# Patient Record
Sex: Male | Born: 1985 | Hispanic: Yes | Marital: Single | Smoking: Never smoker
Health system: Southern US, Community
[De-identification: ages and names within clinical notes are randomized; demographics above are authoritative.]

---

## 2016-07-05 ENCOUNTER — Emergency Department (HOSPITAL_COMMUNITY): Payer: Self-pay

## 2016-07-05 ENCOUNTER — Encounter (HOSPITAL_COMMUNITY): Payer: Self-pay | Admitting: *Deleted

## 2016-07-05 ENCOUNTER — Emergency Department (HOSPITAL_COMMUNITY)
Admission: EM | Admit: 2016-07-05 | Discharge: 2016-07-06 | Disposition: A | Discharge status: Home / Self Care | Payer: Self-pay | Attending: Emergency Medicine | Admitting: Emergency Medicine

## 2016-07-05 DIAGNOSIS — Y9389 Activity, other specified: Secondary | ICD-10-CM | POA: Insufficient documentation

## 2016-07-05 DIAGNOSIS — F10920 Alcohol use, unspecified with intoxication, uncomplicated: Secondary | ICD-10-CM

## 2016-07-05 DIAGNOSIS — F10120 Alcohol abuse with intoxication, uncomplicated: Secondary | ICD-10-CM | POA: Insufficient documentation

## 2016-07-05 DIAGNOSIS — Y929 Unspecified place or not applicable: Secondary | ICD-10-CM | POA: Insufficient documentation

## 2016-07-05 DIAGNOSIS — Y999 Unspecified external cause status: Secondary | ICD-10-CM | POA: Insufficient documentation

## 2016-07-05 DIAGNOSIS — S0083XA Contusion of other part of head, initial encounter: Secondary | ICD-10-CM | POA: Insufficient documentation

## 2016-07-05 NOTE — Progress Notes (Signed)
CM spoke with pt who confirms uninsured [place] resident with no pcp.  CM discussed and provided written information to assist pt with determining choice for uninsured accepting pcps, discussed the importance of pcp vs EDP services for f/u care, www.needymeds.org, www.goodrx.com, discounted pharmacies and other [place] resources such as [facility] , [facility], affordable care act, financial assistance, uninsured dental services, [place] med assist, DSS and  health department  Reviewed resources for [place] uninsured accepting pcps like [facility], [facility] [address], [facility], [facility], local urgent care centers, [facility], [facility], general medical clinics, [facility], [facility] plus others, medication resources, [facility] out [facility]ies and housing Pt voiced understanding and appreciation of resources provided   Provided [facility] contact information Pt agreed to a referral Cm completed referral Pt to be contact by [facility] clinical liaison  
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
## 2016-07-05 NOTE — ED Notes (Signed)
Patient sleeping, breathing even and unlabored, NAD at this time. 
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
## 2016-07-05 NOTE — ED Notes (Signed)
Patient sleeping, breathing even and unlabored.  NAD at this time. 

## 2016-07-05 NOTE — ED Notes (Signed)
Pt now has correct ID band from registration
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
## 2016-07-06 NOTE — ED Notes (Signed)
Patient d/c'd self care.  F/U reviewed with patient patient verbalized understanding.
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
## 2016-07-20 ENCOUNTER — Emergency Department (HOSPITAL_COMMUNITY)
Admission: EM | Admit: 2016-07-20 | Discharge: 2016-07-21 | Disposition: A | Discharge status: Home / Self Care | Payer: No Typology Code available for payment source | Attending: Emergency Medicine | Admitting: Emergency Medicine

## 2016-07-20 ENCOUNTER — Emergency Department (HOSPITAL_COMMUNITY): Payer: No Typology Code available for payment source

## 2016-07-20 ENCOUNTER — Encounter (HOSPITAL_COMMUNITY): Payer: Self-pay

## 2016-07-20 DIAGNOSIS — Y9389 Activity, other specified: Secondary | ICD-10-CM | POA: Diagnosis not present

## 2016-07-20 DIAGNOSIS — F1290 Cannabis use, unspecified, uncomplicated: Secondary | ICD-10-CM | POA: Diagnosis not present

## 2016-07-20 DIAGNOSIS — Z5181 Encounter for therapeutic drug level monitoring: Secondary | ICD-10-CM | POA: Diagnosis not present

## 2016-07-20 DIAGNOSIS — F1410 Cocaine abuse, uncomplicated: Secondary | ICD-10-CM | POA: Insufficient documentation

## 2016-07-20 DIAGNOSIS — Y92410 Unspecified street and highway as the place of occurrence of the external cause: Secondary | ICD-10-CM | POA: Insufficient documentation

## 2016-07-20 DIAGNOSIS — F10920 Alcohol use, unspecified with intoxication, uncomplicated: Secondary | ICD-10-CM

## 2016-07-20 DIAGNOSIS — R51 Headache: Secondary | ICD-10-CM | POA: Insufficient documentation

## 2016-07-20 DIAGNOSIS — Y999 Unspecified external cause status: Secondary | ICD-10-CM | POA: Diagnosis not present

## 2016-07-20 DIAGNOSIS — F10120 Alcohol abuse with intoxication, uncomplicated: Secondary | ICD-10-CM | POA: Insufficient documentation

## 2016-07-20 LAB — URINALYSIS, ROUTINE W REFLEX MICROSCOPIC
BILIRUBIN URINE: NEGATIVE
Glucose, UA: NEGATIVE mg/dL
HGB URINE DIPSTICK: NEGATIVE
Ketones, ur: NEGATIVE mg/dL
Leukocytes, UA: NEGATIVE
Nitrite: NEGATIVE
PH: 5.5 (ref 5.0–8.0)
Protein, ur: NEGATIVE mg/dL
SPECIFIC GRAVITY, URINE: 1.01 (ref 1.005–1.030)

## 2016-07-20 LAB — CBC WITH DIFFERENTIAL/PLATELET
Basophils Absolute: 0.1 10*3/uL (ref 0.0–0.1)
Basophils Relative: 1 %
EOS ABS: 0.1 10*3/uL (ref 0.0–0.7)
EOS PCT: 1 %
HCT: 46 % (ref 39.0–52.0)
HEMOGLOBIN: 15.6 g/dL (ref 13.0–17.0)
LYMPHS ABS: 2.2 10*3/uL (ref 0.7–4.0)
LYMPHS PCT: 32 %
MCH: 30.5 pg (ref 26.0–34.0)
MCHC: 33.9 g/dL (ref 30.0–36.0)
MCV: 89.8 fL (ref 78.0–100.0)
MONOS PCT: 11 %
Monocytes Absolute: 0.7 10*3/uL (ref 0.1–1.0)
NEUTROS PCT: 55 %
Neutro Abs: 3.7 10*3/uL (ref 1.7–7.7)
Platelets: 207 10*3/uL (ref 150–400)
RBC: 5.12 MIL/uL (ref 4.22–5.81)
RDW: 13.9 % (ref 11.5–15.5)
WBC: 6.8 10*3/uL (ref 4.0–10.5)

## 2016-07-20 LAB — BASIC METABOLIC PANEL
Anion gap: 12 (ref 5–15)
CHLORIDE: 105 mmol/L (ref 101–111)
CO2: 23 mmol/L (ref 22–32)
CREATININE: 1.01 mg/dL (ref 0.61–1.24)
Calcium: 9 mg/dL (ref 8.9–10.3)
GFR calc Af Amer: >60 mL/min (ref >60)
GFR calc non Af Amer: >60 mL/min (ref >60)
Glucose, Bld: 108 mg/dL — ABNORMAL HIGH (ref 65–99)
Potassium: 3.5 mmol/L (ref 3.5–5.1)
SODIUM: 140 mmol/L (ref 135–145)

## 2016-07-20 LAB — ETHANOL: Alcohol, Ethyl (B): 132 mg/dL — ABNORMAL HIGH (ref <5)

## 2016-07-20 MED ORDER — SODIUM CHLORIDE 0.9 % IV BOLUS (SEPSIS)
1000.0000 mL | Freq: Once | INTRAVENOUS | Status: AC
Start: 2016-07-20 — End: 2016-07-21
  Administered 2016-07-20: 1000 mL via INTRAVENOUS
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
## 2016-07-21 LAB — RAPID URINE DRUG SCREEN, HOSP PERFORMED
AMPHETAMINES: NOT DETECTED
BARBITURATES: NOT DETECTED
Benzodiazepines: NOT DETECTED
COCAINE: POSITIVE — AB
OPIATES: NOT DETECTED
TETRAHYDROCANNABINOL: NOT DETECTED

## 2016-07-21 MED ORDER — SODIUM CHLORIDE 0.9 % IV BOLUS (SEPSIS)
1000.0000 mL | Freq: Once | INTRAVENOUS | Status: AC
  Administered 2016-07-21: 1000 mL via INTRAVENOUS

## 2016-07-21 NOTE — ED Notes (Signed)
Patient sleeping, breathing even and unlabored.  NAD at this time. 

## 2016-07-21 NOTE — ED Notes (Signed)
Patient unable to stand, stated "he just needed to sit, I feel tired"  Will inform MD.
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
